# Patient Record
Sex: Female | Born: 1940 | Race: White | Hispanic: No | Smoking: Former smoker
Health system: Southern US, Community
[De-identification: ages and names within clinical notes are randomized; demographics above are authoritative.]

---

## 2003-12-10 ENCOUNTER — Inpatient Hospital Stay (HOSPITAL_COMMUNITY): Admission: EM | Admit: 2003-12-10 | Discharge: 2004-01-03 | Payer: Self-pay | Admitting: *Deleted

## 2003-12-12 ENCOUNTER — Encounter (INDEPENDENT_AMBULATORY_CARE_PROVIDER_SITE_OTHER): Payer: Self-pay | Admitting: Cardiology

## 2003-12-13 ENCOUNTER — Encounter (INDEPENDENT_AMBULATORY_CARE_PROVIDER_SITE_OTHER): Payer: Self-pay | Admitting: Specialist

## 2003-12-20 ENCOUNTER — Encounter (INDEPENDENT_AMBULATORY_CARE_PROVIDER_SITE_OTHER): Payer: Self-pay | Admitting: Cardiology

## 2003-12-22 ENCOUNTER — Encounter: Admission: RE | Admit: 2003-12-22 | Discharge: 2003-12-22 | Payer: Self-pay | Admitting: Family Medicine

## 2003-12-29 ENCOUNTER — Encounter (INDEPENDENT_AMBULATORY_CARE_PROVIDER_SITE_OTHER): Payer: Self-pay | Admitting: Specialist

## 2004-02-07 ENCOUNTER — Encounter: Admission: RE | Admit: 2004-02-07 | Discharge: 2004-02-07 | Payer: Self-pay | Admitting: Family Medicine

## 2004-03-06 ENCOUNTER — Ambulatory Visit: Payer: Self-pay | Admitting: Sports Medicine

## 2004-03-25 ENCOUNTER — Ambulatory Visit: Payer: Self-pay | Admitting: Sports Medicine

## 2004-04-04 ENCOUNTER — Ambulatory Visit: Payer: Self-pay | Admitting: Family Medicine

## 2004-05-06 ENCOUNTER — Ambulatory Visit: Payer: Self-pay | Admitting: Family Medicine

## 2004-05-22 ENCOUNTER — Ambulatory Visit: Payer: Self-pay | Admitting: Family Medicine

## 2004-06-12 ENCOUNTER — Ambulatory Visit: Payer: Self-pay | Admitting: Family Medicine

## 2004-06-14 ENCOUNTER — Encounter (INDEPENDENT_AMBULATORY_CARE_PROVIDER_SITE_OTHER): Payer: Self-pay | Admitting: *Deleted

## 2004-06-14 LAB — CONVERTED CEMR LAB

## 2004-07-09 ENCOUNTER — Ambulatory Visit: Payer: Self-pay | Admitting: Family Medicine

## 2004-08-14 ENCOUNTER — Ambulatory Visit: Payer: Self-pay | Admitting: Family Medicine

## 2004-09-11 ENCOUNTER — Ambulatory Visit: Payer: Self-pay | Admitting: Family Medicine

## 2004-10-11 ENCOUNTER — Ambulatory Visit: Payer: Self-pay | Admitting: Family Medicine

## 2004-11-13 ENCOUNTER — Ambulatory Visit: Payer: Self-pay | Admitting: Family Medicine

## 2004-12-24 ENCOUNTER — Ambulatory Visit: Payer: Self-pay | Admitting: Family Medicine

## 2005-02-04 ENCOUNTER — Ambulatory Visit: Payer: Self-pay | Admitting: Family Medicine

## 2005-04-08 ENCOUNTER — Ambulatory Visit: Payer: Self-pay | Admitting: Family Medicine

## 2005-09-08 ENCOUNTER — Ambulatory Visit: Payer: Self-pay | Admitting: Sports Medicine

## 2005-10-21 ENCOUNTER — Ambulatory Visit: Payer: Self-pay | Admitting: Sports Medicine

## 2006-08-07 ENCOUNTER — Encounter (INDEPENDENT_AMBULATORY_CARE_PROVIDER_SITE_OTHER): Payer: Self-pay | Admitting: *Deleted

## 2006-08-27 ENCOUNTER — Ambulatory Visit: Payer: Self-pay | Admitting: Family Medicine

## 2006-08-27 DIAGNOSIS — R5381 Other malaise: Secondary | ICD-10-CM

## 2006-08-27 DIAGNOSIS — R5383 Other fatigue: Secondary | ICD-10-CM

## 2006-08-27 DIAGNOSIS — R002 Palpitations: Secondary | ICD-10-CM | POA: Insufficient documentation

## 2006-10-15 ENCOUNTER — Encounter: Payer: Self-pay | Admitting: Family Medicine

## 2006-11-19 ENCOUNTER — Encounter: Payer: Self-pay | Admitting: Family Medicine

## 2006-12-02 ENCOUNTER — Ambulatory Visit: Payer: Self-pay | Admitting: Family Medicine

## 2006-12-02 DIAGNOSIS — M159 Polyosteoarthritis, unspecified: Secondary | ICD-10-CM

## 2007-05-14 ENCOUNTER — Ambulatory Visit: Payer: Self-pay | Admitting: Family Medicine

## 2007-05-14 DIAGNOSIS — J310 Chronic rhinitis: Secondary | ICD-10-CM | POA: Insufficient documentation

## 2007-05-14 DIAGNOSIS — M25519 Pain in unspecified shoulder: Secondary | ICD-10-CM

## 2007-05-14 DIAGNOSIS — J449 Chronic obstructive pulmonary disease, unspecified: Secondary | ICD-10-CM

## 2007-05-24 ENCOUNTER — Encounter: Admission: RE | Admit: 2007-05-24 | Discharge: 2007-05-24 | Payer: Self-pay | Admitting: Family Medicine

## 2007-06-21 ENCOUNTER — Ambulatory Visit: Payer: Self-pay | Admitting: Family Medicine

## 2007-06-21 DIAGNOSIS — R030 Elevated blood-pressure reading, without diagnosis of hypertension: Secondary | ICD-10-CM | POA: Insufficient documentation

## 2007-08-23 ENCOUNTER — Ambulatory Visit: Payer: Self-pay | Admitting: Family Medicine

## 2007-08-23 ENCOUNTER — Encounter: Payer: Self-pay | Admitting: *Deleted

## 2007-08-23 DIAGNOSIS — Z87440 Personal history of urinary (tract) infections: Secondary | ICD-10-CM | POA: Insufficient documentation

## 2007-08-23 DIAGNOSIS — N10 Acute tubulo-interstitial nephritis: Secondary | ICD-10-CM

## 2007-08-23 LAB — CONVERTED CEMR LAB
Protein, U semiquant: >=300
pH: 7.5

## 2007-08-25 ENCOUNTER — Encounter: Payer: Self-pay | Admitting: Family Medicine

## 2007-08-25 ENCOUNTER — Telehealth (INDEPENDENT_AMBULATORY_CARE_PROVIDER_SITE_OTHER): Payer: Self-pay | Admitting: *Deleted

## 2007-08-26 ENCOUNTER — Encounter: Admission: RE | Admit: 2007-08-26 | Discharge: 2007-08-26 | Payer: Self-pay | Admitting: Family Medicine

## 2007-09-05 ENCOUNTER — Emergency Department (HOSPITAL_COMMUNITY): Admission: EM | Admit: 2007-09-05 | Discharge: 2007-09-05 | Payer: Self-pay | Admitting: Emergency Medicine

## 2008-01-24 ENCOUNTER — Ambulatory Visit: Payer: Self-pay | Admitting: Family Medicine

## 2008-01-24 LAB — CONVERTED CEMR LAB
ALT: 8 units/L (ref 0–35)
AST: 18 units/L (ref 0–37)
Albumin: 4.5 g/dL (ref 3.5–5.2)
Chloride: 102 meq/L (ref 96–112)
Creatinine, Ser: 1.62 mg/dL — ABNORMAL HIGH (ref 0.40–1.20)
HDL: 52 mg/dL (ref >39)
MCV: 92.7 fL (ref 78.0–100.0)
Potassium: 5.1 meq/L (ref 3.5–5.3)
RDW: 13.6 % (ref 11.5–15.5)
TSH: 1.08 microintl units/mL (ref 0.350–4.50)
Total Protein: 8.4 g/dL — ABNORMAL HIGH (ref 6.0–8.3)

## 2008-03-08 ENCOUNTER — Telehealth: Payer: Self-pay | Admitting: *Deleted

## 2009-03-22 ENCOUNTER — Telehealth: Payer: Self-pay | Admitting: *Deleted

## 2009-10-12 ENCOUNTER — Encounter: Payer: Self-pay | Admitting: Family Medicine

## 2010-01-04 IMAGING — CR DG ABDOMEN 1V
1 series · 1 of 1 positions shown · non-contrast
Comparison: [REDACTED] portable abdominal radiograph, 12/26/03 and [REDACTED] renal ultrasound, 12/11/03.

CLINICAL DATA: Proteus mirabilis pyelonephritis ? assessment for possible kidney stones.
 DIAGNOSTIC ABDOMEN - 1 VIEW:

[t abdomen supine]
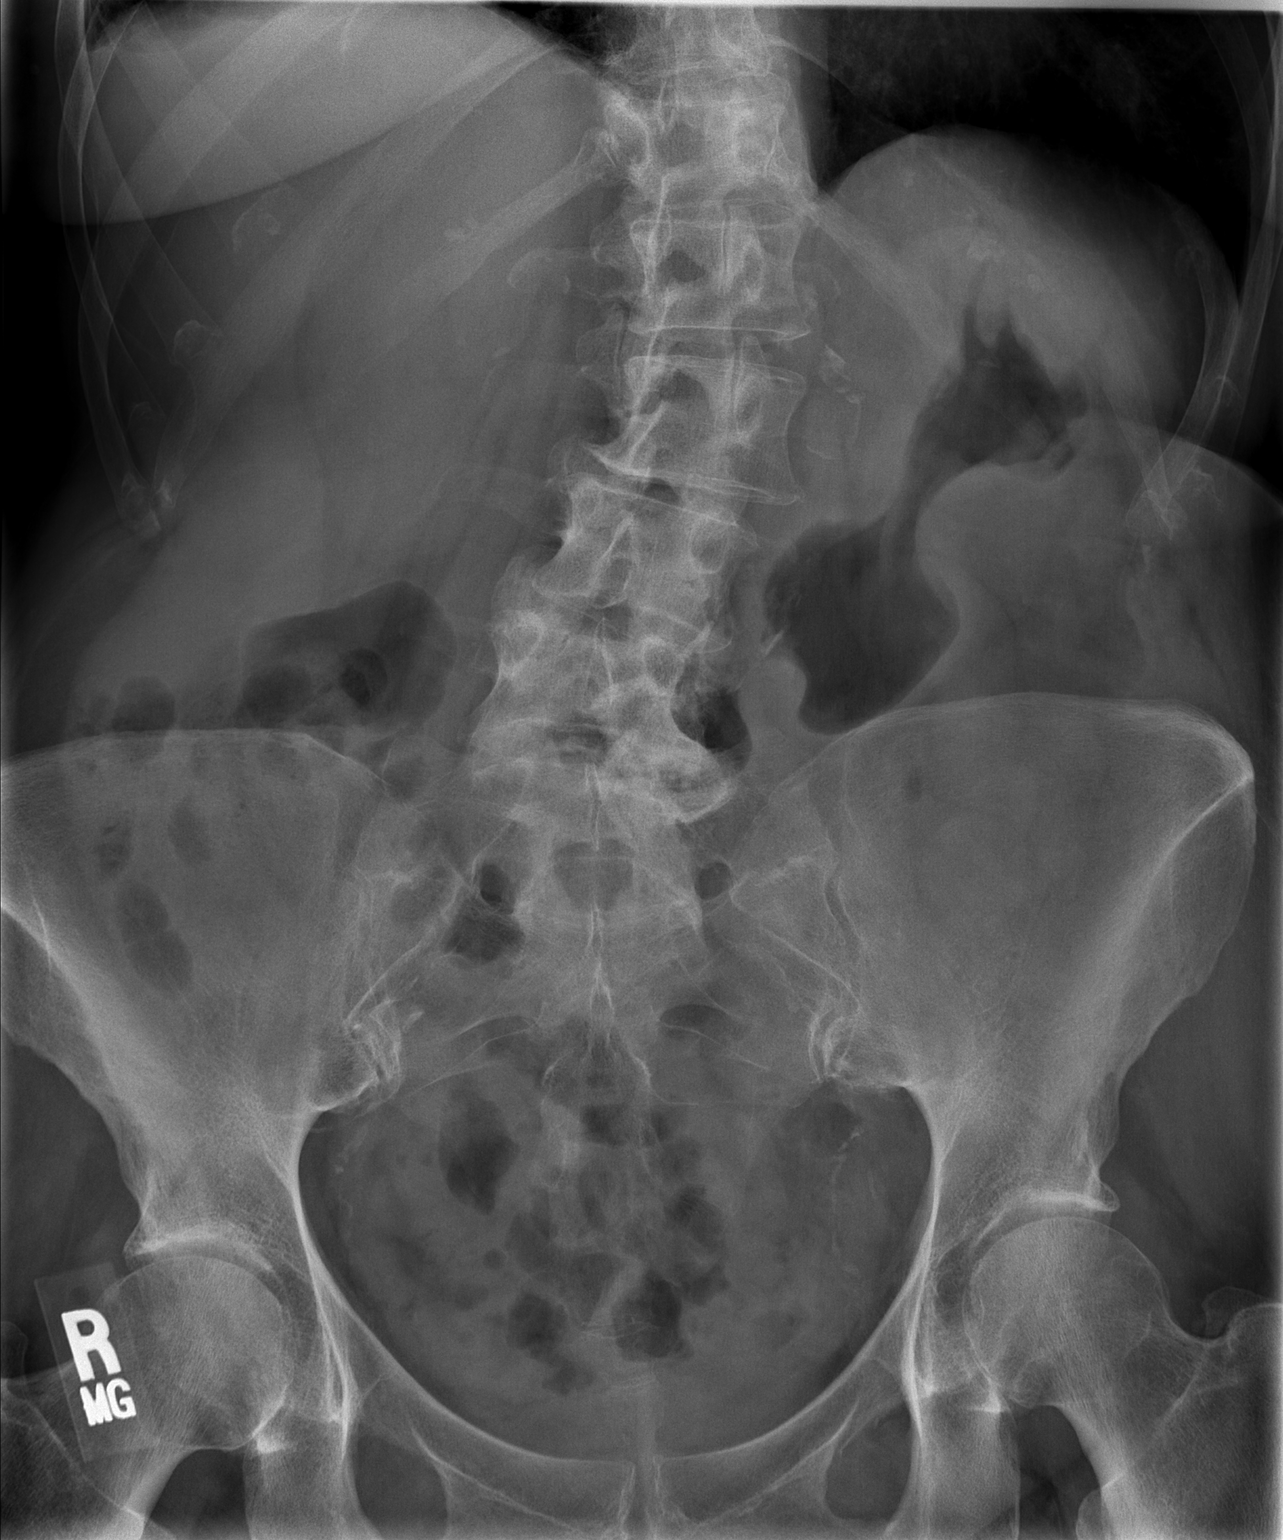

[1 of 1 positions shown; findings below may reference images not displayed]

FINDINGS: No urinary tract calcification seen.  Moderate atheromatous vascular calcification is noted with moderate levoscoliosis thoracolumbar spine junction.  Bowel gas pattern is normal.
IMPRESSION: 1.  No evidence for radiopaque renal calculi.
 2.  Chronic appearing findings as described - no acute abnormality

## 2010-02-28 ENCOUNTER — Encounter: Payer: Self-pay | Admitting: Family Medicine

## 2010-04-15 ENCOUNTER — Telehealth: Payer: Self-pay | Admitting: Family Medicine

## 2010-04-18 ENCOUNTER — Encounter: Payer: Self-pay | Admitting: *Deleted

## 2010-07-10 NOTE — Letter (Signed)
Summary: Wellness Visit Letter  [facility]  [address]   [place], [place] [address]   Phone: [phone]  Fax: [phone]    10/12/2009  [name] [address] [place], [place]  [address]  Dear Ms. Sparger,   We are happy to let you know that since you are covered under Medicare you are able to have a FREE visit at the [facility] to discuss your HEALTH. This is a new benefit for Medicare.  There will be no co-payment.  At this visit you will meet with Luretha Murphy an expert in wellness and the nurse practitioner at our clinic.  At this visit we will discuss ways to keep you healthy and feeling well.  This visit will not replace your regular doctor visit and we cannot refill medications.  We may schedule future blood work, give shots if needed, or schedule tests to look for hidden problems.   You will need to plan to be here at least one hour to talk about your medical history, your current status, review all of your medications, and discuss your future plans for your health.  This information will be entered into your record for your doctor to have and review.  If you are interested in staying healthy, this type of visit can help.  Please call the office at: [phone], to schedule a "Medicare Wellness Visit".  The day of the visit you should bring in all of your medications, including any vitamins, herbs, over the counter products you take.  Make a list of all the other doctors that you see, so we know who they are. If you have any other health documents please bring them.  We look forward to helping you stay healthy.  Sincerely,  Luretha Murphy NP            Appended Document: Wellness Visit Letter mailed.

## 2010-07-10 NOTE — Progress Notes (Signed)
Summary: refill  Phone Note Refill Request Call back at Home Phone [phone] Message from:  Patient  Refills Requested: Medication #1:  METOPROLOL TARTRATE 25 MG TABS take 1/2 tab twice a day  Medication #2:  TRAMADOL HCL 50 MG  TABS one tab four times daily. Initial call taken by: De Nurse,  April 15, 2010 10:28 AM  Follow-up for Phone Call        Done Follow-up by: Doralee Albino MD,  April 15, 2010 4:34 PM    New/Updated Medications: METOPROLOL TARTRATE 25 MG TABS (METOPROLOL TARTRATE) take 1/2 tab twice a day TRAMADOL HCL 50 MG  TABS (TRAMADOL HCL) one tab four times daily Prescriptions: TRAMADOL HCL 50 MG  TABS (TRAMADOL HCL) one tab four times daily  #120 x 5   Entered and Authorized by:   Doralee Albino MD   Signed by:   Doralee Albino MD on 04/15/2010   Method used:   Electronically to        [facility]* (retail)       [address] [place], [place]  [address]       Ph: [phone]       Fax: [phone]   RxID:   [id number] METOPROLOL TARTRATE 25 MG TABS (METOPROLOL TARTRATE) take 1/2 tab twice a day  #90 x 3   Entered and Authorized by:   Doralee Albino MD   Signed by:   Doralee Albino MD on 04/15/2010   Method used:   Electronically to        [facility]* (retail)       [address] [place], [place]  [address]       Ph: [phone]       Fax: [phone]   RxID:   [id number]

## 2010-07-10 NOTE — Miscellaneous (Signed)
Summary: flu vaccine received  Clinical Lists Changes  patient brought by immunization record for Flu vaccine and pneumonia vaccine. received flu vaccine 02/28/2010 at [facility] in [place] on 02/28/2010 and pneumonia and flu  vaccine on 03/01/2009 at [facility] in [place] Observations: Added new observation of FLU VAX: Historical (02/28/2010 10:38) Added new observation of PNEUMOVAX: Historical (03/01/2009 10:38) Added new observation of FLU VAX: Historical (03/01/2009 10:38)      Influenza Immunization History:    Influenza # 1:  Historical (02/28/2010)  Pneumovax Immunization History:    Pneumovax # 1:  Historical (03/01/2009)

## 2010-07-10 NOTE — Miscellaneous (Signed)
Summary: Asthma clarifcation  Clinical Lists Changes  Problems: Changed problem from ASTHMA, UNSPECIFIED, UNSPECIFIED STATUS (ICD-493.90) to ASTHMA, PERSISTENT, MODERATE (ICD-493.90)

## 2010-08-20 ENCOUNTER — Encounter: Payer: Self-pay | Admitting: Home Health Services

## 2010-10-25 NOTE — Consult Note (Signed)
NAME:  Alyssa Carrillo, Alyssa Carrillo                       ACCOUNT NO.:  [id number]   MEDICAL RECORD NO.:  [id number]                   PATIENT TYPE:  INP   LOCATION:  2922                                 FACILITY:  [facility]   PHYSICIAN:  Doylene Canning. Ladona Ridgel, M.D.               DATE OF BIRTH:  [birth year]   DATE OF CONSULTATION:  DATE OF DISCHARGE:                                   CONSULTATION   CONSULTING PHYSICIAN:  Doylene Canning. Ladona Ridgel, M.D.   REQUESTING PHYSICIAN:  Eduardo Osier. Sharyn Lull, M.D.   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   HISTORY OF PRESENT ILLNESS:  The patient is a 70 year old woman without any  significant past medical history who was admitted to the hospital with  malaise, chest pain, shortness of breath of one days' duration.  She was  found to be in atrial fibrillation.  She had chest pain on the right.  Apparently, this was more pleuritic than anything.  She was renal  insufficiency with a creatinine of 2.6 on admission.  Chest x-ray  demonstrated what looked like pneumonia.  The patient unfortunately  developed worsening respiratory function and increasingly rapid atrial  fibrillation with rapid ventricular response and ultimately required  mechanical ventilation.  She has been treated aggressively with IV beta-  blockers, calcium channel-blockers, and most recently amiodarone.  She has  had pauses of over three seconds and underwent permanent temporary pacemaker  insertion all under the direction of Dr. Sharyn Lull.  She is now referred for  additional evaluation.  The patient can not provide any details of her past  medical history but apparently there was a recent outpatient treatment for  pneumonia.   FAMILY HISTORY:  Notable for a deceased mother who had previously had an MI,  and a father who died secondary to pneumonia after abdominal surgery.   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  She  has one child who lives in [place].  She continues to smoke cigarettes.  She has previously denied any alcohol or illicit drug use.   REVIEW OF SYSTEMS:  Obtained by the chart.  She is unable to provide or  answer questions.  Essentially her review of systems was negative, except  for the malaise and myalgias just before admission.   PHYSICAL EXAMINATION:  GENERAL:  Demonstrates an intubated and sedated  middle-aged woman in no distress.  VITAL SIGNS:  The blood pressure was 123/50, the pulse was 95 and regular,  the respirations were 16.  HEENT:  Normocephalic and atraumatic.  The pupils were equal and round.  The  oropharynx was moist.  Sclerae were anicteric.  NECK:  Revealed no jugular venous distention.  Carotids were 2+ and  symmetric.  The trachea was midline.  LUNGS:  Revealed scattered rales and rhonchi throughout.  CARDIOVASCULAR:  Revealed a regular rate and rhythm  with a normal S1 and S2.  There was a soft systolic murmur at the left lower sternal border.  ABDOMEN:  Obese.  It was soft, nontender, nondistended.  There was no  organomegaly.  EXTREMITIES:  Demonstrated no cyanosis, clubbing, or edema.   EKG demonstrated a normal sinus rhythm with normal axis and intervals.  Telemetry strips demonstrate atrial fibrillation throughout ventricular  response as well as bradycardiac with heart rates below 30 beats per minute  and positive over three seconds.   LABS:  Demonstrated a borderline troponin.  The creatinine was back down to  normal at 1.3.   IMPRESSION:  1. Atrial fibrillation with a rapid ventricular response.  2. Sinus pauses of over three seconds on calcium blockers and digoxin.  3. Probable pneumonia and sepsis with subsequent respiratory failure     requiring mechanical ventilation.  4. Status post temporary pacemaker.   DISCUSSION:  We will plan to watch the patient with you.  I would recommend  removing the temporary pacemaker as soon as  possible.  We will plan to  follow the patient with you.  Hopefully, she will not required permanent  pacemaker insertion.  If we can just get her over her underlying illness and  continue amiodarone.                                               Doylene Canning. Ladona Ridgel, M.D.    GWT/MEDQ  D:  12/12/2003  T:  12/12/2003  Job:  [id number]

## 2010-10-25 NOTE — Discharge Summary (Signed)
NAME:  Alyssa Carrillo, Alyssa Carrillo                       ACCOUNT NO.:  [id number]   MEDICAL RECORD NO.:  [id number]                   PATIENT TYPE:  INP   LOCATION:  3315                                 FACILITY:  [facility]   PHYSICIAN:  Dwana Curd. Para March, M.D.              DATE OF BIRTH:  18-May-1941   DATE OF ADMISSION:  12/09/2003  DATE OF DISCHARGE:  01/03/2004                                 DISCHARGE SUMMARY   DISCHARGE DIAGNOSES:  1. Necrotizing pneumonia complicated by septic shock and pneumothorax.  2. Atrial fibrillation with tachycardic-bradycardic syndrome.  3. Increased creatinine, now resolved.  4. Hypokalemia, now resolved.  5. Candidal urinary tract infection.  6. Dermatitis.  7. Diarrhea, now resolved.  8. Anemia, now resolved.  9. Dysphagia secondary to endotracheal intubation and tracheostomy.   DISCHARGE MEDICATIONS:  Please see discharge orders that will accompany the  patient to long term rehab facility.   FOLLOWUP:  The patient needs followup with her primary care physician either  at [facility] or with the physician of her choosing.  This is to be arranged  after the patient is approaching discharge point from the long term rehab  facility.   PROCEDURES/DIAGNOSTIC STUDIES:  1. Renal ultrasound, on December 11, 2003, impression:  Slightly echogenic     kidneys compatible with medical renal disease, normal renal sizes with no     evidence of hydronephrosis, and a small right pleural effusion.  2. Chest CT, on December 14, 2003, showing residual small loculated right     hydropneumothorax despite the presence of chest tube, asymmetric air     space infiltrates most marked in the right middle lobe, left lower lobe     and posterior, and the right lower lobe, cystic changes in the right     middle lobe of uncertain acuity.  No discrete lung abscess is evident.  3. Chest CT, on December 28, 2003, worsening right middle lobe and right lower     lobe air space consolidation, atelectasis with  fluid levels now evident     in the region of the right middle lobe, partial interval improvement in     the left upper and lower lobe air space and alveolar infiltrates, and     loculated right pleural effusion marginally increased in size since     previous exam.  4. Chest tube placement on December 13, 2003.  5. Chest tube removal on December 24, 2003.  6. Chest x-ray, multiple.  7. Thoracentesis on December 29, 2003.  8. PICC line placement on December 21, 2003.  9. EKG, on December 09, 2003, showing sinus tachycardia at 108 beats per minute     with no axis deviation and no acute changes.   CONSULTANTS:  1. Dr. Hermelinda Medicus, ear, nose, and throat.  2. Dr. Jayme Cloud, critical care medicine.  3. Dr. Marlane Mingle, cardiology.  4. Dr. Ladona Ridgel, electrophysiology.   ADMISSION HISTORY AND PHYSICAL:  Please  see dictated H&P.   HOSPITAL COURSE:  1. Necrotizing pneumonia with septic shock complicated by pneumothorax.  The     patient presented, on December 09, 2003, with signs of respiratory distress     and initial chest x-ray showed a right sided polylobar pneumonia.  The     patient was initially placed on azithromycin and Rocephin; however, her     respiratory status continued to decline in the following days.  By December 12, 2003, the patient's respiratory status had deteriorated to the point     where she had to be sedated and placed on a ventilator.  The patient's     course on the vent was complicated by several factors.  The patient was     found on CT to have a necrotizing pneumonia.  The patient was treated     with azithromycin and cefepime.  The patient was hypotensive and required     pressors to maintain adequate blood pressure.  Additionally, the patient     had a right sided pneumothorax requiring chest tube placement on December 13, 2003.  By December 14, 2003, the patient was still in shock requiring the use     of pressors.  Her antibiotic regimen had been changed from azithromycin     and cefepime to  Zosyn with the eventual addition of vancomycin.  The     patient was maintained on Zosyn plus vancomycin for over two weeks.     During this time, the patient had a slow wean from the vent.  Her blood     pressure improved to the point that pressors could be removed.  The     patient was slowly weaned from the vent.  The chest tube was removed on     December 24, 2003.  Due to the protracted nature of the vent wean,     tracheostomy was placed by Dr. Haroldine Laws on December 21, 2003.  At this point,     the patient was placed on trach collar and extubated.  The patient's FIO2     was weaned down over the next days.  By January 02, 2004, the patient's FIO2     requirement was between 35% and 40% on trach collar.  After the patient     was transferred out of the ICU and into the stepdown unit, speech therapy     was consulted and the patient was fitted for Passy-Muir valve.  As of     January 02, 2004, the patient was sating well and able to talk without     respiratory distress on trach collar with her Passy-Muir valve, and at     this point all signs and symptoms of shock were resolved.  The patient     was cultured on multiple times, specifically for pleural fluid with the     patient's pneumothorax and chest tube placement and again for pleural     fluid on aspiration under ultrasound guidance.  At no point did the     cultures grow out a definitive organism.  2. Atrial fibrillation with tachy-brady.  While the patient was critically     ill, the patient began having runs of Afib with long pauses and tachy-     brady syndrome.  At this point, cardiology was consulted who began a     treatment regimen with a beta-blocker and amiodarone.  Electrophysiology     was  consulted for placement of a temporary venous pacemaker.  As the     patient's critical period of illness resolved, the patient experienced     fewer and fewer runs of Afib and resolved her tachy-brady syndrome.  The    pacer was removed without  ill effect.  3. Increased creatinine, now resolved.  The patient presented with initially     increased creatinine that precluded early CT scan with contrast.  The     patient had a renal ultrasound, on December 11, 2003, with the results stated     above.  With hydration, the patient's creatinine trended down and was     stabilized.  By January 02, 2004, the patient's creatinine was within normal     limits.  4. Hypokalemia, now resolved.  The patient experienced multiple episodes of     hypokalemia in the ICU and the stepdown unit.  This was not unexpected     given the patient's need for Lasix.  The patient's potassium was     replenished as needed and at the time of discharge was within normal     limits and stable.  5. Candidal UTI.  The patient had two urine cultures on December 29, 2003.  They     both showed greater than 100,000 colonies of yeast.  These were final.     The patient was treated with fluconazole.  At the time of discharge, the     patient was still undergoing treatment with the fluconazole.  6. Dermatitis.  The patient had a dermatitis during this hospitalization     that did respond to topical nystatin treatment.  At the time of discharge     this dermatitis was resolving.  7. Diarrhea.  The patient had episodic bouts of diarrhea during this     hospitalization.  The diarrhea was attributed to the patient's tube feeds     and to her extensive need for antibiotics.  The patient was notably C.     diff negative x 3.  8. Anemia.  The patient developed anemia during the ICU with heme positive     stools.  The patient was transfused two units of packed red blood cells,     on December 21, 2003, and at this point the anemia was resolved.  At time of     discharge, the patient's hemoglobin/hematocrit had been stable for an     extended period of time.  9. Dysphagia.  The patient was evaluated by speech therapy as the     endotracheal tube was removed and the tracheostomy was placed.   The     patient was evaluated for ability to swallow without aspiration.  The     patient initially failed a swallow study with an FEES study.  However, on     re-evaluation the patient passed her swallow study with the exception of     thin liquids and as of January 02, 2004, her diet had been advanced to     nectar-thick liquids with solids p.o.  This replaced the patient's tube     feeds that she had been receiving for an extended period during this     hospitalization.  The patient was tolerating p.o. well.  It is expected     that after the patient is discharged to a long term rehab facility and     the patient becomes more accustomed to the trach and/or the tracheostomy     is  removed, the patient's aspiration risk to thin liquids will be greatly     diminished.   DISCHARGE LABS:  As follows:  January 02, 2004:  PTT 32, PT 13, INR 1.0.  Reticulocyte count 80.3.  CBC notable for white blood cells of 8.9, hemoglobin 11.5, hematocrit 34.1, platelet count 439.  BMP within normal  limits with the exception of glucose at 125.                                                Dwana Curd Para March, M.D.    GSD/MEDQ  D:  01/02/2004  T:  01/02/2004  Job:  [id number]   cc:   Primary care physician  [facility]   Hermelinda Medicus, M.D.  [address]  [place] [address]  Fax: [phone]   Jayme Cloud, Dr.  Critical Care Medicine   Eduardo Osier. Sharyn Lull, M.D.  [address]  [place]  [place] [address]  Fax: [phone]   Doylene Canning. Ladona Ridgel, M.D.

## 2010-10-25 NOTE — Op Note (Signed)
NAME:  Alyssa Carrillo, Alyssa Carrillo                       ACCOUNT NO.:  [id number]   MEDICAL RECORD NO.:  [id number]                   PATIENT TYPE:  INP   LOCATION:  2922                                 FACILITY:  [facility]   PHYSICIAN:  Hermelinda Medicus, M.D.                DATE OF BIRTH:  September 04, 1940   DATE OF PROCEDURE:  12/21/2003  DATE OF DISCHARGE:                                 OPERATIVE REPORT   INDICATIONS FOR PROCEDURE:  This patient is a 70 year old female who has had  persistent pneumonia.  She also has had a pneumothorax.  She has had a  situation where she has been intubated for approximately nine days and has  not responded as quickly as hoped and she now enters for a tracheostomy for  further respiratory care and respiratory ventilation with less extensive  space between her lungs and the ventilator.   PREOPERATIVE DIAGNOSIS:  Respiratory failure.   POSTOPERATIVE DIAGNOSIS:  Respiratory failure.   PROCEDURE:  Tracheostomy.   SURGEON:  Hermelinda Medicus, M.D.   ANESTHESIA:  Local 1% Xylocaine with epinephrine with MAC standby with Sheldon Silvan, M.D.   DESCRIPTION OF PROCEDURE:  The patient was placed in the supine position and  after the shoulder roll was placed, the patient was prepped with Betadine  and with head straight with anesthesia present in observation.  Sedation was  given, 1% Xylocaine with epinephrine was injected after the patient was  appropriately draped, and then a horizontal incision was made at  approximately the level of the second tracheal ring.  This was carried down  through the skin, platysma, and subcutaneous fat, down to the strap muscles  and the cricoid cartilage was easily palpable.  In the second tracheal ring  we were able to expose without difficulty and without disturbing the isthmus  of the thyroid.  We took a small anterior section, approximately 1 cm in  length of that second tracheal ring.  After all hemostasis was established  in the soft  tissues.  We were able to do this without penetrating so deeply,  we penetrated the cuff of the intubation tube and then we were able to trim  this appropriately so that we had an opening that would be stable.  With  this, all hemostasis was established and then the intubation tube was slowly  retracted and the trachea was suctioned of a considerable amount of mucoid  debris and a #6 single cuff Shiley tracheostomy tube was placed without  difficulty.  Once this was placed, the ventilation was transferred over to  the tracheostomy tube. The cuff was inflated gently and the respiratory  support was continued.  The intubation tube was then removed.  The  tracheostomy tube was then sutured in place using 0 silk and then a  tracheostomy collar was placed to hold the  tracheostomy tube in secure position.  Tracheostomy drape was then placed.  All hemostasis was  completely established and the patient tolerated the  procedure very well.  Preoperative orders will be resumed as before.  She  will be returned to the intensive care unit.                                               Hermelinda Medicus, M.D.    JC/MEDQ  D:  12/21/2003  T:  12/21/2003  Job:  [id number]   cc:   William A. Leveda Anna, M.D.  Fax: [phone]

## 2010-10-25 NOTE — H&P (Signed)
NAME:  Alyssa Carrillo, Alyssa Carrillo                       ACCOUNT NO.:  [id number]   MEDICAL RECORD NO.:  [id number]                   PATIENT TYPE:  INP   LOCATION:  1824                                 FACILITY:  [facility]   PHYSICIAN:  Santiago Bumpers. Hensel, M.D.             DATE OF BIRTH:  1940/12/18   DATE OF ADMISSION:  12/09/2003  DATE OF DISCHARGE:                                HISTORY & PHYSICAL   CHIEF COMPLAINT:  Chest pain and shortness of breath.   HISTORY OF PRESENT ILLNESS:  The patient is a 70 year old white female who  began having muscle and joint pain three days ago with a previous history  of similar symptoms.  These arthralgias and myalgias resolved two days ago  after taking Tylenol.  She had new onset of chest pain with shortness of  breath that began yesterday.  The chest pain was right-sided and the pain  increased with movement and chest wall palpation.  The pain increased  throughout yesterday and today.  She had a concurrent onset of shortness of  breath that has increased in severity yesterday and today.  The patient  reports the use of albuterol inhaler with some temporary relief in the  shortness of breath.  At the presentation the patient reports an 8/10 chest  pain and persistent shortness of breath in spite of morphine 4 mg and 2 L of  O2 via nasal cannula in the emergency room.  The patient also reports that  her shortness of breath was increasing when she was supine and that she had  an increase in her chest pain when she was supine or in the decubitus  position.  The patient has no history of blood clots or no history of a  clotting disorder.  She also reports a positive sensation of warmth for the  past two days without sweats.   PAST MEDICAL HISTORY:  The patient had a recent outpatient treatment for  pneumonia.  This is in November and December 2004.  This was outpatient  treatment.  The patient reports that she has no other health problems or  admissions to  the hospital.  The patient reports that she does not regularly  see an M.D.   FAMILY HISTORY:  The mother is deceased.  She has a positive history of MI  at an unknown age.  The father is deceased secondary to a pneumonia after an  abdominal surgery.  This surgery occurred when her father was in his 11s.  He did have a positive history of TB.   SOCIAL HISTORY:  The patient was an Film/video editor, and she is divorced.  She  has one child who lives in [place].  She presently smokes one-half pack a  day and she reports that she had heavier tobacco use in previous years.  The  patient reports no alcohol nor illicit drug use.   REVIEW OF SYSTEMS:  GENERAL:  The patient  reports a positive feeling of  warmth but without sweats during the past two days.  She has no fevers, no  chills.  HEENT:  She has no vision changes.  GASTROINTESTINAL:  She has no  changes in stooling.  GENITOURINARY:  She has no changes in urination.  CARDIOVASCULAR:  As above per HPI.  RESPIRATORY:  As above per HPI.  SKIN:  No rash.  MUSCULOSKELETAL:  As above per HPI.   MEDICATIONS:  Other than the albuterol that she was using the past few days,  none.   ALLERGIES:  PENICILLIN, anaphylaxis.  IBUPROFEN, angioedema.   PHYSICAL EXAMINATION:  VITAL SIGNS:  Temperature 97.4, pulse 100.7, blood  pressure 85/52, respiratory rate 26, O2 saturation 95% on 4 L of nasal  cannula.  GENERAL:  The patient was sitting in bed in obvious pain with rapid  breathing.  HEENT:  Normocephalic, atraumatic.  Pupils equally round, and reactive to  light and accommodation.  Her tympanic membranes are within normal limits  bilaterally.  She had no oral erythema.  She did have dentures.  NECK:  She had no lymphadenopathy in the cervical or supraclavicular areas.  CARDIOVASCULAR:  She had normal S1 and S2.  She had a regular rate and  rhythm with no murmurs, rubs, or gallops.  She had 2+ pulses in the  extremities that were symmetric.  She had  rapid capillary refill.  CARDIAC:  Respiratory rate:  She had crackles with slight wheezes in the  right lung base, otherwise clear to auscultation with rapid and shallow  inspirations.  ABDOMEN:  Her belly was soft.  She had slight tenderness to very deep  palpation in the right lower quadrant.  She had positive bowel sounds.  She  had positive tympany over the epigastric area.  RECTAL:  Deferred.  EXTREMITIES:  She had no pain on palpation of her lower legs bilaterally.  She had no edema.   LABORATORY EXAMINATIONS AND CHEST X-RAY:  Chest x-ray showed a right-sided  polylobar pneumonia.  Her EKG showed sinus tachycardia at 108 beats per  minute with no axis deviation and no acute changes.  Labs drawn in the  emergency room:  The patient had three sets of CK-MBs, myoglobins, and  Troponin I's.  At 2000 hours the patient had a CK-MB of 10.4, myoglobin of  greater than 500, and troponin I of less than 0.05.  At 2100 hours she had a  CK-MB of 7.8, myoglobin greater than 500, troponin I of less than 0.05.  At  2200 hours CK-MB of 8.5, myoglobin of greater than 500, troponin I of less  than 0.05.  The patient's CMET was as follows:  Sodium 130, potassium 4.5,  chloride 97, bicarb 19, BUN 72, creatinine 2.6, glucose 92, calcium 8.9,  total protein 6.4, albumin 2.6, AST 37, ALT 25, total bilirubin 0.5,  alkaline phosphatase 97.  The patient had a CBC  pending, an ABG pending,  and cardiac enzymes x2 pending.   ASSESSMENT AND PLAN:  This is a 70 year old white female with:   1. Chest pain.  Differential diagnosis includes pulmonary embolus,     myocardial infarction, pneumonia, and lung cancer.  The patient will be     evaluated for pulmonary embolus.  The patient is also being admitted for     rule out myocardial infarction with a.m. electrocardiogram and completion    of a cardiac enzyme cycle.  Pneumonia and lung cancer are less likely     secondary  to fast onset of symptoms and especially  for pneumonia with the     lack of a fever.  Due to the father's history of tuberculosis, we will     place a PPD.  The patient is being given morphine for pain with     supplemental O2.  I will address the need for possible anticoagulation.     Importantly, the patient was initially scheduled for pulmonary embolus     protocol CT and the step of obtaining a D-dimer value was skipped.  This     was because of an initial high index of suspicion for pulmonary embolus;     however, after getting the patient's CMET completed and having a BUN of     72 and a creatinine of 2.6, the patient was deemed not a candidate for     pulmonary embolus protocol CT.  Given this patient's serum creatinine, a     D-dimer has now been ordered and possible need for anticoagulation will     be determined based on the D-dimer value.  In terms of the patient's     creatinine of 2.6, it is unknown if this is the patient's baseline.  This     may be, in fact, due to a prerenal syndrome with dehydration.  Given     that, the patient will be rehydrated throughout the night, the creatinine     will be reassessed.  If possible, the patient may still go for pulmonary     embolus protocol CT pending further lab values.  In the meantime, a     urinalysis and FENA will be obtained.  Also, the patient is being     presumably treated for a pneumonia.  Blood culture are being obtained.     The patient was prescribed azithromycin and Rocephin in the emergency     room.  This will be continued.  Of note, with the patient's cardiac labs,     the troponin was negative in the emergency room and the increase in     myoglobin and CK-MB may be possibly related to the symptoms of myalgia     and arthralgia earlier in the week and/or the patient's increased work of     breathing.  2. Shortness of breath.  The patient will be evaluated as above.     Supplemental O2 will be given as needed.  There is an ABG pending.  Will     follow and  assess need for increased intervention.  3. Fluids, electrolytes, and nutrition.  The patient is tolerating p.o.     Will follow electrolytes and replete as needed.     She is also, as above, being rehydrated, and we will re-evaluate her     serum creatinine and for smoking cessation.  Will discuss the patient for     discharge.   DISPOSITION:  The patient is being admitted to a telemetry bed for further  evaluation and monitoring.      Dwana Curd Para March, M.D.                    William A. Leveda Anna, M.D.    GSD/MEDQ  D:  12/10/2003  T:  12/11/2003  Job:  [phone]

## 2011-04-03 ENCOUNTER — Telehealth: Payer: Self-pay | Admitting: Family Medicine

## 2011-04-03 MED ORDER — TRAMADOL HCL 50 MG PO TABS: 50.0000 mg | ORAL_TABLET | Freq: Four times a day (QID) | ORAL | Status: DC | PRN

## 2011-04-03 NOTE — Telephone Encounter (Signed)
Done and Rx called in.

## 2011-04-03 NOTE — Telephone Encounter (Signed)
Alyssa Carrillo had to resched her appt due to a family emergency to the 11/14 @3 :15.  Need refill on her tramadol until next appt.  Please fax to [facility] on [address].  Call her if there is any problem with this request.

## 2011-04-04 ENCOUNTER — Ambulatory Visit: Payer: Self-pay | Admitting: Family Medicine

## 2011-04-23 ENCOUNTER — Ambulatory Visit (INDEPENDENT_AMBULATORY_CARE_PROVIDER_SITE_OTHER): Payer: Medicare Other | Admitting: Family Medicine

## 2011-04-23 ENCOUNTER — Encounter: Payer: Self-pay | Admitting: Family Medicine

## 2011-04-23 VITALS — BP 174/79 | HR 83 | Ht 70.0 in | Wt 156.4 lb

## 2011-04-23 DIAGNOSIS — I10 Essential (primary) hypertension: Secondary | ICD-10-CM

## 2011-04-23 DIAGNOSIS — R300 Dysuria: Secondary | ICD-10-CM

## 2011-04-23 DIAGNOSIS — R030 Elevated blood-pressure reading, without diagnosis of hypertension: Secondary | ICD-10-CM

## 2011-04-23 DIAGNOSIS — J45909 Unspecified asthma, uncomplicated: Secondary | ICD-10-CM

## 2011-04-23 DIAGNOSIS — N390 Urinary tract infection, site not specified: Secondary | ICD-10-CM | POA: Insufficient documentation

## 2011-04-23 LAB — POCT URINALYSIS DIPSTICK
Bilirubin, UA: NEGATIVE
Glucose, UA: NEGATIVE
Ketones, UA: NEGATIVE
Spec Grav, UA: 1.015
pH, UA: 6

## 2011-04-23 LAB — COMPLETE METABOLIC PANEL WITH GFR
BUN: 55 mg/dL — ABNORMAL HIGH (ref 6–23)
CO2: 29 mEq/L (ref 19–32)
Creat: 1.58 mg/dL — ABNORMAL HIGH (ref 0.50–1.10)
GFR, Est African American: 38 mL/min/{1.73_m2} — ABNORMAL LOW
GFR, Est Non African American: 33 mL/min/{1.73_m2} — ABNORMAL LOW
Glucose, Bld: 84 mg/dL (ref 70–99)
Sodium: 137 mEq/L (ref 135–145)
Total Bilirubin: 0.3 mg/dL (ref 0.3–1.2)
Total Protein: 8 g/dL (ref 6.0–8.3)

## 2011-04-23 LAB — POCT UA - MICROSCOPIC ONLY

## 2011-04-23 MED ORDER — TRAMADOL HCL 50 MG PO TABS: 50.0000 mg | ORAL_TABLET | Freq: Four times a day (QID) | ORAL | Status: DC

## 2011-04-23 MED ORDER — CIPROFLOXACIN HCL 500 MG PO TABS: 500.0000 mg | ORAL_TABLET | Freq: Two times a day (BID) | ORAL | Status: AC

## 2011-04-23 MED ORDER — METOPROLOL TARTRATE 25 MG PO TABS: ORAL_TABLET | ORAL | Status: DC

## 2011-04-23 MED ORDER — ZOSTER VACCINE LIVE 19400 UNT/0.65ML ~~LOC~~ SOLR: 0.6500 mL | Freq: Once | SUBCUTANEOUS | Status: AC

## 2011-04-23 MED ORDER — ALBUTEROL 90 MCG/ACT IN AERS: 2.0000 | INHALATION_SPRAY | Freq: Four times a day (QID) | RESPIRATORY_TRACT | Status: DC | PRN

## 2011-04-23 NOTE — Patient Instructions (Signed)
I refilled both your tramadol and metoprolol Remember, you can take up to 8 regular tylenol per day with the tramadol I sent an antibiotic for your urinary infection cipro. I sent a prescription for the shingles vaccine. I sent a prescription for an inhaler for you to use only when short of breath. As we discussed it is recommended that you get a mammogram and colonoscopy.  I understand you are not interested.  Let me know if you change you mind. Quit grieving about you lost job and get to work on a healthier life pattern.  That may mean you have to do a few things your rather not do.

## 2011-04-24 ENCOUNTER — Encounter: Payer: Self-pay | Admitting: Family Medicine

## 2011-04-24 LAB — LIPID PANEL
Cholesterol: 205 mg/dL — ABNORMAL HIGH (ref 0–200)
HDL: 47 mg/dL (ref >39)

## 2011-04-25 ENCOUNTER — Encounter: Payer: Self-pay | Admitting: Family Medicine

## 2011-04-25 DIAGNOSIS — I10 Essential (primary) hypertension: Secondary | ICD-10-CM | POA: Insufficient documentation

## 2011-04-25 NOTE — Assessment & Plan Note (Signed)
Seems stable off controler.  I provided her with an albuterol inhaler for relief.

## 2011-04-25 NOTE — Assessment & Plan Note (Signed)
I will say that it is well controled based on her home BP readings.

## 2011-04-25 NOTE — Progress Notes (Signed)
  Subjective:    Patient ID: Alyssa Carrillo, female    DOB: [birth year], 70 y.o.   MRN: [id number]  HPI First visit in quite some time for this healthy 70 yo female.  She has a fear of physicians, blood work and hospitals from a near fatal necrotizing pneumonia several years ago.  She also has some strong beliefs.  For example: she refuses both mammography and colonoscopy despite my strong recommendations.  I was able to talk her into some blood work.  Complains of recent dysuria.  Emotionally fine.  She recognizes that she has not been very active since her sudden retirement. (Company moved out of state and she was given the choice of relocating or retiring.)  Financially OK. Eats healthy.    Health maintenance is OK except for mammogram and colonoscopy as above.  She is willing to have the zostavax and I will send a prescription.  Blood pressure high today, which is a surprise to her.  She brings in her home blood pressure readings and they are all good.  Has previously documented white coat hypertension.   Not using any controlers for her asthma/COPD.  She occaisionally gets SOB on exertion.    Review of Systems     Objective:   Physical Exam BP noted Neck supple without adenopathy Lungs, clear Cardiac RRR without m Abd benign. Ext no edema or cyanosis       Assessment & Plan:

## 2011-04-29 ENCOUNTER — Other Ambulatory Visit: Payer: Self-pay | Admitting: Family Medicine

## 2011-12-09 ENCOUNTER — Other Ambulatory Visit: Payer: Self-pay | Admitting: Family Medicine

## 2011-12-09 NOTE — Telephone Encounter (Signed)
Spoke with patient and informed her that she would need to come in for an office visit before this can be filled. Patient was very upset because she was told only to come in 1 time a year for her annual and she can get this medication refilled for a year if she wanted to. I offered her an appointment with a doctor to get this refilled but she stated "I only see Dr. Leveda Anna no one else." I have set her up an appointment 7/10 @ 3pm with Dr. Leveda Anna and she was informed that this is concerned a work in and she may have to wait a little wait to be seen.

## 2011-12-09 NOTE — Telephone Encounter (Signed)
Patient is calling because she needs a refill on her Tramadol.  She is getting ready to leave on a 6 week tour of [place] and needs this before then.  She uses [facility] on [address].  She said that she would appreciate a call once this is done and it is ok to leave a message.

## 2011-12-17 ENCOUNTER — Ambulatory Visit (INDEPENDENT_AMBULATORY_CARE_PROVIDER_SITE_OTHER): Payer: Medicare Other | Admitting: Family Medicine

## 2011-12-17 ENCOUNTER — Encounter: Payer: Self-pay | Admitting: Family Medicine

## 2011-12-17 DIAGNOSIS — N182 Chronic kidney disease, stage 2 (mild): Secondary | ICD-10-CM

## 2011-12-17 DIAGNOSIS — M159 Polyosteoarthritis, unspecified: Secondary | ICD-10-CM

## 2011-12-17 DIAGNOSIS — I10 Essential (primary) hypertension: Secondary | ICD-10-CM

## 2011-12-17 LAB — CBC
Hemoglobin: 14.3 g/dL (ref 12.0–15.0)
MCH: 31.9 pg (ref 26.0–34.0)
MCHC: 33.9 g/dL (ref 30.0–36.0)

## 2011-12-17 MED ORDER — TRAMADOL HCL 50 MG PO TABS: 50.0000 mg | ORAL_TABLET | Freq: Four times a day (QID) | ORAL | Status: DC

## 2011-12-18 ENCOUNTER — Encounter: Payer: Self-pay | Admitting: Family Medicine

## 2011-12-18 DIAGNOSIS — N182 Chronic kidney disease, stage 2 (mild): Secondary | ICD-10-CM | POA: Insufficient documentation

## 2011-12-18 LAB — COMPLETE METABOLIC PANEL WITH GFR
Albumin: 4.7 g/dL (ref 3.5–5.2)
Alkaline Phosphatase: 67 U/L (ref 39–117)
CO2: 24 mEq/L (ref 19–32)
Calcium: 10.2 mg/dL (ref 8.4–10.5)
Chloride: 100 mEq/L (ref 96–112)
GFR, Est African American: 40 mL/min — ABNORMAL LOW
GFR, Est Non African American: 34 mL/min — ABNORMAL LOW
Glucose, Bld: 92 mg/dL (ref 70–99)
Potassium: 4.7 mEq/L (ref 3.5–5.3)
Sodium: 136 mEq/L (ref 135–145)
Total Protein: 8.4 g/dL — ABNORMAL HIGH (ref 6.0–8.3)

## 2011-12-18 NOTE — Progress Notes (Signed)
  Subjective:    Patient ID: Alyssa Carrillo, female    DOB: 07/17/1940, 71 y.o.   MRN: [id number]  HPI  Doing quite well with no complaints.  Leaves for 6 weeks in [place] this weekend. Needs recheck of hypercalcemia. Did not get zostavax due to cost.  Still planning to get  BP up by our read but has known white coat hypertension in addition to regular hypertension.  Home BP readings have been great. Needs refill on tramadol     Review of Systems     Objective:   Physical Exam Lungs clear Cardiac RRR without m or g abd benign        Assessment & Plan:

## 2011-12-18 NOTE — Assessment & Plan Note (Signed)
Home BP readings great so no adjustment in meds.

## 2011-12-18 NOTE — Assessment & Plan Note (Signed)
Stable creat 

## 2011-12-18 NOTE — Assessment & Plan Note (Signed)
Refill tramadol

## 2011-12-18 NOTE — Patient Instructions (Signed)
I will call with labs. Have fun in [place]. Let me know how to help you get your zostavax.

## 2011-12-18 NOTE — Assessment & Plan Note (Signed)
Recheck.  Normal now so will resolve this problem

## 2012-04-15 ENCOUNTER — Other Ambulatory Visit: Payer: Self-pay | Admitting: Family Medicine

## 2012-04-15 MED ORDER — METOPROLOL TARTRATE 25 MG PO TABS: ORAL_TABLET | ORAL | Status: DC

## 2012-04-15 NOTE — Telephone Encounter (Signed)
This patient was last in here in 12/2011, but it looks like this was filled a year ago. Would you like for this patient to come in before refill?

## 2012-04-15 NOTE — Telephone Encounter (Signed)
Pt states that [facility] pharmacy told her that she would need to call us to get refill on her Metoprolol - she will need it by next week.

## 2012-04-15 NOTE — Telephone Encounter (Signed)
Meds refilled.

## 2012-07-29 ENCOUNTER — Other Ambulatory Visit: Payer: Self-pay | Admitting: Family Medicine

## 2012-07-29 MED ORDER — TRAMADOL HCL 50 MG PO TABS: 50.0000 mg | ORAL_TABLET | Freq: Four times a day (QID) | ORAL | Status: DC

## 2012-07-29 NOTE — Telephone Encounter (Signed)
Patient sent me letter asking for refill.  Called to verify pharmacy.

## 2012-09-14 ENCOUNTER — Other Ambulatory Visit: Payer: Self-pay | Admitting: Family Medicine

## 2012-09-14 MED ORDER — TRAMADOL HCL 50 MG PO TABS: ORAL_TABLET | ORAL | Status: DC

## 2013-01-19 ENCOUNTER — Encounter: Payer: Self-pay | Admitting: Family Medicine

## 2013-01-19 ENCOUNTER — Ambulatory Visit (INDEPENDENT_AMBULATORY_CARE_PROVIDER_SITE_OTHER): Payer: Medicare Other | Admitting: Family Medicine

## 2013-01-19 VITALS — BP 130/69 | HR 72 | Temp 97.9°F | Ht 70.5 in | Wt 151.0 lb

## 2013-01-19 DIAGNOSIS — N182 Chronic kidney disease, stage 2 (mild): Secondary | ICD-10-CM

## 2013-01-19 DIAGNOSIS — I10 Essential (primary) hypertension: Secondary | ICD-10-CM

## 2013-01-19 DIAGNOSIS — E7800 Pure hypercholesterolemia, unspecified: Secondary | ICD-10-CM

## 2013-01-19 DIAGNOSIS — M159 Polyosteoarthritis, unspecified: Secondary | ICD-10-CM

## 2013-01-19 MED ORDER — TRAMADOL HCL 50 MG PO TABS: ORAL_TABLET | ORAL | Status: DC

## 2013-01-19 MED ORDER — ZOSTER VACCINE LIVE 19400 UNT/0.65ML ~~LOC~~ SOLR: 0.6500 mL | Freq: Once | SUBCUTANEOUS | Status: DC

## 2013-01-19 MED ORDER — METOPROLOL TARTRATE 25 MG PO TABS: ORAL_TABLET | ORAL | Status: DC

## 2013-01-19 MED ORDER — PRAVASTATIN SODIUM 40 MG PO TABS: 40.0000 mg | ORAL_TABLET | Freq: Every day | ORAL | Status: DC

## 2013-01-19 NOTE — Patient Instructions (Addendum)
I would recommend that you get a bone density to check for osteoporosis.   I sent another prescription for the Shingles vaccine to [facility]..  If you get the vaccine call and let me know so that I can update my records. You can stop your fish oil and start the pravastatin to lower cholesterol.  Statins decrease your risk of heart attack and stroke.   Get your blood work done in 2-3 months.  Remember to call the day before and not eat that morning. You can get your flu shot around the same time.  Let me know as you have in the past when you get your flu shot.

## 2013-01-20 ENCOUNTER — Encounter: Payer: Self-pay | Admitting: Family Medicine

## 2013-01-20 DIAGNOSIS — E7800 Pure hypercholesterolemia, unspecified: Secondary | ICD-10-CM | POA: Insufficient documentation

## 2013-01-20 NOTE — Progress Notes (Signed)
  Subjective:    Patient ID: Alyssa Carrillo, female    DOB: [birth year], 72 y.o.   MRN: [id number]  HPI [name] comes in for the first time in over one year.  She has strong opinions about what she will and will not do.   HBP doing well on meds.  No side effects. COPD.  Quit smoking for several years.  S/P severe pneumonia.  She is not on any meds per her choice.  Definitely gets palpitations and tachycardia with albuterol.  Feels she likely gets with flovent.  She is stable and is not interested in retrying flovent or spiriva at this point. HPDP Refuses mammogram.  Is now willing to get shingles vaccine.  Very good about getting annual flu shot.   Cholesterol  Has been taking Omega 3.  Did 10 year CAD risk which was well above 7.5%  Discussed switch to statin.    Review of Systems Denies chest pain, bleeding, wt change, appetite change, stool change.  Does have DOE but mild and does not want medications.     Objective:   Physical Exam Lungs clear, no wheeze.  Mild decrease vital capacity.  Mild increase AP diameter. Cardiac RRR without m or g Abd benign.       Assessment & Plan:

## 2013-01-20 NOTE — Assessment & Plan Note (Signed)
Well controled on current meds 

## 2013-01-20 NOTE — Assessment & Plan Note (Signed)
Deserves statin based on 10 year risk.  Will start and DC fish oil.  Will check labs when on statin for 2-3 months.  Future orders entered.

## 2013-01-20 NOTE — Assessment & Plan Note (Signed)
Needs recheck lab.  See hypercholesterol

## 2013-01-24 ENCOUNTER — Other Ambulatory Visit: Payer: Self-pay | Admitting: Family Medicine

## 2013-01-24 DIAGNOSIS — I10 Essential (primary) hypertension: Secondary | ICD-10-CM

## 2013-01-24 MED ORDER — METOPROLOL TARTRATE 25 MG PO TABS: ORAL_TABLET | ORAL | Status: DC

## 2013-09-19 ENCOUNTER — Telehealth: Payer: Self-pay | Admitting: Family Medicine

## 2013-09-19 NOTE — Telephone Encounter (Signed)
Spoke with patient and she is fine with that and very appreciative.

## 2013-09-19 NOTE — Telephone Encounter (Signed)
Dear Cl[place] AstersWhite Team i can write it tomorrow as I plan onbeing there---or you can get a preceptor to write it Sorry for inconvenience I am out w a family illness THANKS! Nestor RampSara L Deetta Siegmann

## 2013-09-19 NOTE — Telephone Encounter (Signed)
Refill request for Tramadol 

## 2013-09-20 ENCOUNTER — Other Ambulatory Visit: Payer: Self-pay | Admitting: Family Medicine

## 2013-09-20 DIAGNOSIS — M159 Polyosteoarthritis, unspecified: Secondary | ICD-10-CM

## 2013-09-20 MED ORDER — TRAMADOL HCL 50 MG PO TABS: ORAL_TABLET | ORAL | Status: DC

## 2013-09-20 NOTE — Progress Notes (Signed)
Dear Cliffton AstersWhite Team I am giving her one month supply--to get refills etc she will have [facility] Nestor RampSara L Jaquay Morneault

## 2013-09-22 ENCOUNTER — Other Ambulatory Visit: Payer: Self-pay | Admitting: Family Medicine

## 2013-09-22 DIAGNOSIS — M159 Polyosteoarthritis, unspecified: Secondary | ICD-10-CM

## 2013-09-22 MED ORDER — TRAMADOL HCL 50 MG PO TABS: ORAL_TABLET | ORAL | Status: DC

## 2013-09-22 NOTE — Telephone Encounter (Signed)
Received letter requesting refill.  She does annual exams each August.  Tramadol appropriate and called in.

## 2013-12-28 ENCOUNTER — Encounter: Payer: Self-pay | Admitting: Home Health Services

## 2014-01-26 ENCOUNTER — Encounter: Payer: Self-pay | Admitting: Home Health Services

## 2014-02-08 ENCOUNTER — Ambulatory Visit: Payer: Medicare Other | Admitting: Family Medicine

## 2014-02-15 ENCOUNTER — Ambulatory Visit (INDEPENDENT_AMBULATORY_CARE_PROVIDER_SITE_OTHER): Payer: Medicare Other | Admitting: Family Medicine

## 2014-02-15 ENCOUNTER — Encounter: Payer: Self-pay | Admitting: Family Medicine

## 2014-02-15 VITALS — BP 143/74 | HR 82 | Temp 98.5°F | Ht 70.5 in | Wt 146.5 lb

## 2014-02-15 DIAGNOSIS — N3000 Acute cystitis without hematuria: Secondary | ICD-10-CM

## 2014-02-15 DIAGNOSIS — Z23 Encounter for immunization: Secondary | ICD-10-CM

## 2014-02-15 DIAGNOSIS — E7800 Pure hypercholesterolemia, unspecified: Secondary | ICD-10-CM

## 2014-02-15 DIAGNOSIS — R300 Dysuria: Secondary | ICD-10-CM

## 2014-02-15 DIAGNOSIS — M159 Polyosteoarthritis, unspecified: Secondary | ICD-10-CM

## 2014-02-15 DIAGNOSIS — R030 Elevated blood-pressure reading, without diagnosis of hypertension: Secondary | ICD-10-CM

## 2014-02-15 DIAGNOSIS — N182 Chronic kidney disease, stage 2 (mild): Secondary | ICD-10-CM

## 2014-02-15 DIAGNOSIS — N390 Urinary tract infection, site not specified: Secondary | ICD-10-CM | POA: Insufficient documentation

## 2014-02-15 DIAGNOSIS — I10 Essential (primary) hypertension: Secondary | ICD-10-CM

## 2014-02-15 DIAGNOSIS — M150 Primary generalized (osteo)arthritis: Secondary | ICD-10-CM

## 2014-02-15 LAB — COMPREHENSIVE METABOLIC PANEL
ALK PHOS: 90 U/L (ref 39–117)
ALT: 15 U/L (ref 0–35)
AST: 25 U/L (ref 0–37)
Albumin: 3.7 g/dL (ref 3.5–5.2)
BUN: 28 mg/dL — ABNORMAL HIGH (ref 6–23)
CO2: 29 mEq/L (ref 19–32)
Calcium: 9.6 mg/dL (ref 8.4–10.5)
Chloride: 99 mEq/L (ref 96–112)
Creat: 1.17 mg/dL — ABNORMAL HIGH (ref 0.50–1.10)
Glucose, Bld: 78 mg/dL (ref 70–99)
Potassium: 4.8 mEq/L (ref 3.5–5.3)
SODIUM: 137 meq/L (ref 135–145)
TOTAL PROTEIN: 7.9 g/dL (ref 6.0–8.3)
Total Bilirubin: 0.3 mg/dL (ref 0.2–1.2)

## 2014-02-15 LAB — POCT UA - MICROSCOPIC ONLY

## 2014-02-15 LAB — POCT URINALYSIS DIPSTICK
Bilirubin, UA: NEGATIVE
Glucose, UA: NEGATIVE
KETONES UA: NEGATIVE
Nitrite, UA: NEGATIVE
Spec Grav, UA: 1.015
Urobilinogen, UA: 0.2
pH, UA: 6.5

## 2014-02-15 LAB — CBC
HCT: 34.8 % — ABNORMAL LOW (ref 36.0–46.0)
Hemoglobin: 11.3 g/dL — ABNORMAL LOW (ref 12.0–15.0)
MCH: 29.4 pg (ref 26.0–34.0)
MCHC: 32.5 g/dL (ref 30.0–36.0)
MCV: 90.6 fL (ref 78.0–100.0)
Platelets: 501 10*3/uL — ABNORMAL HIGH (ref 150–400)
RBC: 3.84 MIL/uL — ABNORMAL LOW (ref 3.87–5.11)
RDW: 15.4 % (ref 11.5–15.5)
WBC: 7.9 10*3/uL (ref 4.0–10.5)

## 2014-02-15 LAB — LIPID PANEL
CHOLESTEROL: 205 mg/dL — AB (ref 0–200)
HDL: 38 mg/dL — AB (ref >39)
LDL Cholesterol: 100 mg/dL — ABNORMAL HIGH (ref 0–99)
Total CHOL/HDL Ratio: 5.4 Ratio
Triglycerides: 333 mg/dL — ABNORMAL HIGH (ref <150)
VLDL: 67 mg/dL — AB (ref 0–40)

## 2014-02-15 MED ORDER — TRAMADOL HCL 50 MG PO TABS: ORAL_TABLET | ORAL | Status: DC

## 2014-02-15 MED ORDER — METOPROLOL TARTRATE 25 MG PO TABS: ORAL_TABLET | ORAL | Status: DC

## 2014-02-15 MED ORDER — SULFAMETHOXAZOLE-TMP DS 800-160 MG PO TABS: 1.0000 | ORAL_TABLET | Freq: Two times a day (BID) | ORAL | Status: DC

## 2014-02-15 MED ORDER — ZOSTER VACCINE LIVE 19400 UNT/0.65ML ~~LOC~~ SOLR: 0.6500 mL | Freq: Once | SUBCUTANEOUS | Status: DC

## 2014-02-15 NOTE — Patient Instructions (Signed)
You should get three immunizations this fall 1. Prevnar - the new pneumonia vaccine today. 2. Flu shot - you will get today. 3. I will give you a prescription for the shingles vaccine.  I will call with the blood work results Start taking one calcium pill per day again. Keep an eye on your blood pressure at home.  Let me know if the average is more than 140/90  See me in six months. Start getting more active as the weather cools down.

## 2014-02-16 ENCOUNTER — Encounter: Payer: Self-pay | Admitting: Family Medicine

## 2014-02-16 LAB — TSH: TSH: 1.281 u[IU]/mL (ref 0.350–4.500)

## 2014-02-16 NOTE — Assessment & Plan Note (Signed)
Allergic to cipro Throat swelling with penicillin - so I will avoid cephalosporins. Treat with bactrim.

## 2014-02-16 NOTE — Progress Notes (Signed)
   Subjective:    Patient ID: Alyssa Carrillo, female    DOB: 05-10-1941, 73 y.o.   MRN: [id number]  HPI once a year visit for this patient who has very strict ideas about what she does and does not want from medicine.  She has a PTSD type picture from a severe pneumonia that almost killed her several years ago.  Issues 1. BP up in the office.  She follows at home and BP is fine.  She has both true HBP and white coat hypertension.  I believe her home BP readings. 2. Dysuria for several weeks.  Feels generally bad.  No fever. 3. Likely has an element of COPD.  Not currently exercising.  Hot weather makes it difficult for her to walk outside due to breathing.   4. Way behind on HPDP.  Will take immunizations. To get flu and prevnar today.  States will take the shingles vaccine if I prescribe again.  Not interested in testing.  Specifically refused mammo, colon cancer screen (even hemocults) and bone density.   Does take tramadol and wants refill    Review of Systems Neg ROS except as above.     Objective:   Physical ExamLungs clear Cardiac RRR wihout m or g Abd benign        Assessment & Plan:

## 2014-02-16 NOTE — Assessment & Plan Note (Signed)
Refill tramadol

## 2014-02-16 NOTE — Assessment & Plan Note (Addendum)
Recheck is lower LDL which I attribute to her weight loss.

## 2014-02-16 NOTE — Assessment & Plan Note (Signed)
Nicely improved. 

## 2014-02-16 NOTE — Assessment & Plan Note (Signed)
Likely good control based on home BP

## 2014-02-16 NOTE — Assessment & Plan Note (Signed)
Likely cause of elevated BP in office despite good at home.

## 2014-03-28 ENCOUNTER — Other Ambulatory Visit: Payer: Self-pay | Admitting: Family Medicine

## 2014-03-28 ENCOUNTER — Telehealth: Payer: Self-pay | Admitting: Family Medicine

## 2014-03-28 DIAGNOSIS — R300 Dysuria: Secondary | ICD-10-CM

## 2014-03-28 DIAGNOSIS — N3000 Acute cystitis without hematuria: Secondary | ICD-10-CM

## 2014-03-28 NOTE — Telephone Encounter (Signed)
Pt called and still has a UTI. She said it went away mostly but now seems to have come back. She did a OTC test and she said it was positive. She would like Dr. Leveda AnnaHensel to call in the same medication as before. She would also like this call into [facility] on  [address] so that she can use the drive through. jw

## 2014-03-28 NOTE — Telephone Encounter (Signed)
Spoke with patietn and informed her that I talked to Dr. Leveda AnnaHensel and he suggested she come in first and give a urine sample. Patient was not happy about that but then agreed about coming in for a urine sample tomorrow and orders have been pout in. Once the results are back we will let Dr. Leveda AnnaHensel and then go from there on any antibiotics needed

## 2014-03-29 ENCOUNTER — Other Ambulatory Visit (INDEPENDENT_AMBULATORY_CARE_PROVIDER_SITE_OTHER): Payer: Medicare Other

## 2014-03-29 DIAGNOSIS — R300 Dysuria: Secondary | ICD-10-CM

## 2014-03-29 LAB — POCT URINALYSIS DIPSTICK
Bilirubin, UA: NEGATIVE
GLUCOSE UA: NEGATIVE
Ketones, UA: NEGATIVE
Nitrite, UA: NEGATIVE
Spec Grav, UA: 1.01
UROBILINOGEN UA: 0.2
pH, UA: 6

## 2014-03-29 LAB — POCT UA - MICROSCOPIC ONLY

## 2014-03-29 NOTE — Progress Notes (Signed)
Pt came for routine urine and microscopic, also sent culture on urine

## 2014-03-30 MED ORDER — SULFAMETHOXAZOLE-TMP DS 800-160 MG PO TABS: 1.0000 | ORAL_TABLET | Freq: Two times a day (BID) | ORAL | Status: DC

## 2014-03-30 NOTE — Addendum Note (Signed)
Addended by: Uvaldo RisingFLETKE, KYLE J on: 03/30/2014 08:43 AM   Modules accepted: Orders

## 2014-03-30 NOTE — Telephone Encounter (Signed)
Dr. Leveda AnnaHensel out of office, UA brought to my attention by nursing staff, urine culture pending, will send in Bactrim DS 1 tablet BID for 7 days (unable to attempt trial of Cipro/penicillin due to allergies)

## 2014-04-01 LAB — URINE CULTURE: Colony Count: >=100000

## 2014-09-26 ENCOUNTER — Other Ambulatory Visit: Payer: Self-pay | Admitting: Family Medicine

## 2014-09-26 DIAGNOSIS — M159 Polyosteoarthritis, unspecified: Secondary | ICD-10-CM

## 2014-09-26 DIAGNOSIS — M150 Primary generalized (osteo)arthritis: Principal | ICD-10-CM

## 2014-09-26 NOTE — Telephone Encounter (Signed)
Pt called and would a refill on her Tramadol left up front. Please call when ready and if she doesn't answer please leave a voicemail. jw

## 2014-09-27 MED ORDER — TRAMADOL HCL 50 MG PO TABS: ORAL_TABLET | ORAL | Status: DC

## 2015-04-12 ENCOUNTER — Telehealth: Payer: Self-pay | Admitting: Family Medicine

## 2015-04-12 DIAGNOSIS — M150 Primary generalized (osteo)arthritis: Secondary | ICD-10-CM

## 2015-04-12 DIAGNOSIS — M159 Polyosteoarthritis, unspecified: Secondary | ICD-10-CM

## 2015-04-12 DIAGNOSIS — I10 Essential (primary) hypertension: Secondary | ICD-10-CM

## 2015-04-12 MED ORDER — TRAMADOL HCL 50 MG PO TABS: ORAL_TABLET | ORAL | Status: DC

## 2015-04-12 MED ORDER — METOPROLOL TARTRATE 25 MG PO TABS: ORAL_TABLET | ORAL | Status: DC

## 2015-04-12 NOTE — Telephone Encounter (Signed)
Called and [facility] [address]  It is on [address]

## 2015-04-12 NOTE — Telephone Encounter (Signed)
Pt asking for a refill on her metoprolol and tramadol, says she is unable to make it over here because she is helping take care of someone, [facility]

## 2015-11-07 ENCOUNTER — Telehealth: Payer: Self-pay | Admitting: Family Medicine

## 2015-11-07 DIAGNOSIS — M150 Primary generalized (osteo)arthritis: Principal | ICD-10-CM

## 2015-11-07 DIAGNOSIS — M159 Polyosteoarthritis, unspecified: Secondary | ICD-10-CM

## 2015-11-07 MED ORDER — TRAMADOL HCL 50 MG PO TABS: ORAL_TABLET | ORAL | Status: DC

## 2015-11-07 NOTE — Telephone Encounter (Signed)
Done and patient notified.

## 2015-11-07 NOTE — Telephone Encounter (Signed)
Pt called and needs to have a refill on her Tramadol. Please let patient know if there any problems and also that this is done so that she can pick this up. jw

## 2016-04-09 ENCOUNTER — Other Ambulatory Visit: Payer: Self-pay | Admitting: Family Medicine

## 2016-04-09 DIAGNOSIS — I10 Essential (primary) hypertension: Secondary | ICD-10-CM

## 2016-06-16 ENCOUNTER — Other Ambulatory Visit: Payer: Self-pay | Admitting: *Deleted

## 2016-06-16 DIAGNOSIS — I10 Essential (primary) hypertension: Secondary | ICD-10-CM

## 2016-06-16 DIAGNOSIS — M150 Primary generalized (osteo)arthritis: Secondary | ICD-10-CM

## 2016-06-16 DIAGNOSIS — M159 Polyosteoarthritis, unspecified: Secondary | ICD-10-CM

## 2016-06-18 ENCOUNTER — Other Ambulatory Visit: Payer: Self-pay | Admitting: Family Medicine

## 2016-06-18 DIAGNOSIS — M150 Primary generalized (osteo)arthritis: Secondary | ICD-10-CM

## 2016-06-18 DIAGNOSIS — I10 Essential (primary) hypertension: Secondary | ICD-10-CM

## 2016-06-18 DIAGNOSIS — M159 Polyosteoarthritis, unspecified: Secondary | ICD-10-CM

## 2016-06-20 ENCOUNTER — Other Ambulatory Visit: Payer: Self-pay | Admitting: Family Medicine

## 2016-06-20 DIAGNOSIS — I10 Essential (primary) hypertension: Secondary | ICD-10-CM

## 2016-06-20 DIAGNOSIS — M159 Polyosteoarthritis, unspecified: Secondary | ICD-10-CM

## 2016-06-20 DIAGNOSIS — M150 Primary generalized (osteo)arthritis: Secondary | ICD-10-CM

## 2016-06-20 NOTE — Telephone Encounter (Signed)
Pt scheduled an appointment with Dr. Leveda AnnaHensel for the 24th, but will run out of metoprolol and tramadol before then. Pt would like to know if she could get refills just enough to last until next appointment. ep

## 2016-06-23 MED ORDER — METOPROLOL TARTRATE 25 MG PO TABS: 12.5000 mg | ORAL_TABLET | Freq: Two times a day (BID) | ORAL | 0 refills | Status: DC

## 2016-06-23 MED ORDER — TRAMADOL HCL 50 MG PO TABS: ORAL_TABLET | ORAL | 0 refills | Status: DC

## 2016-07-02 ENCOUNTER — Ambulatory Visit: Payer: Self-pay | Admitting: Family Medicine

## 2016-07-10 ENCOUNTER — Ambulatory Visit (INDEPENDENT_AMBULATORY_CARE_PROVIDER_SITE_OTHER): Payer: Medicare Other | Admitting: Family Medicine

## 2016-07-10 ENCOUNTER — Encounter: Payer: Self-pay | Admitting: Family Medicine

## 2016-07-10 VITALS — BP 138/82 | HR 82 | Temp 98.2°F | Ht 71.0 in | Wt 146.4 lb

## 2016-07-10 DIAGNOSIS — R300 Dysuria: Secondary | ICD-10-CM

## 2016-07-10 DIAGNOSIS — J449 Chronic obstructive pulmonary disease, unspecified: Secondary | ICD-10-CM

## 2016-07-10 DIAGNOSIS — F4310 Post-traumatic stress disorder, unspecified: Secondary | ICD-10-CM

## 2016-07-10 DIAGNOSIS — M159 Polyosteoarthritis, unspecified: Secondary | ICD-10-CM

## 2016-07-10 DIAGNOSIS — I10 Essential (primary) hypertension: Secondary | ICD-10-CM

## 2016-07-10 DIAGNOSIS — M150 Primary generalized (osteo)arthritis: Secondary | ICD-10-CM | POA: Diagnosis not present

## 2016-07-10 DIAGNOSIS — N182 Chronic kidney disease, stage 2 (mild): Secondary | ICD-10-CM

## 2016-07-10 DIAGNOSIS — Z0000 Encounter for general adult medical examination without abnormal findings: Secondary | ICD-10-CM

## 2016-07-10 DIAGNOSIS — E7800 Pure hypercholesterolemia, unspecified: Secondary | ICD-10-CM

## 2016-07-10 DIAGNOSIS — N3000 Acute cystitis without hematuria: Secondary | ICD-10-CM | POA: Diagnosis not present

## 2016-07-10 LAB — POCT URINALYSIS DIPSTICK
BILIRUBIN UA: NEGATIVE
Glucose, UA: NEGATIVE
KETONES UA: NEGATIVE
Nitrite, UA: NEGATIVE
PH UA: 6
Spec Grav, UA: 1.015
Urobilinogen, UA: 0.2

## 2016-07-10 LAB — POCT UA - MICROSCOPIC ONLY

## 2016-07-10 MED ORDER — HYDROCHLOROTHIAZIDE 12.5 MG PO TABS: 12.5000 mg | ORAL_TABLET | Freq: Every day | ORAL | 3 refills | Status: AC

## 2016-07-10 MED ORDER — TRAMADOL HCL 50 MG PO TABS: ORAL_TABLET | ORAL | 0 refills | Status: DC

## 2016-07-10 MED ORDER — METOPROLOL TARTRATE 25 MG PO TABS: 12.5000 mg | ORAL_TABLET | Freq: Two times a day (BID) | ORAL | 3 refills | Status: DC

## 2016-07-10 MED ORDER — CEPHALEXIN 500 MG PO CAPS: 500.0000 mg | ORAL_CAPSULE | Freq: Two times a day (BID) | ORAL | 0 refills | Status: AC

## 2016-07-10 NOTE — Patient Instructions (Addendum)
You seem to be in very good shape. Please find a way to take a couple of 15 minute exercise breaks each day.   I sent in a new prescription for a mild fluid pill HCTZ=hydrochlorothiazide.  Take in the morning See me in a few months if you think you need a medication for the night urinary frequency. I sent in an antibiotic for your UTI I will want to give you the new shingles vaccine when it comes out.

## 2016-07-10 NOTE — Progress Notes (Signed)
poc

## 2016-07-11 DIAGNOSIS — F4310 Post-traumatic stress disorder, unspecified: Secondary | ICD-10-CM | POA: Insufficient documentation

## 2016-07-11 DIAGNOSIS — Z0000 Encounter for general adult medical examination without abnormal findings: Secondary | ICD-10-CM | POA: Insufficient documentation

## 2016-07-11 NOTE — Assessment & Plan Note (Signed)
Rx keflex 

## 2016-07-11 NOTE — Assessment & Plan Note (Signed)
Due for lipid panel

## 2016-07-11 NOTE — Assessment & Plan Note (Signed)
Very stable off all controlers.  Knows to get flu shot and avoid exposure to resp infections.

## 2016-07-11 NOTE — Assessment & Plan Note (Signed)
Add HCTZ based on swelling and elevated home BPs.

## 2016-07-11 NOTE — Progress Notes (Signed)
   Subjective:    Patient ID: Alyssa StaggersSharon S Stiverson, female    DOB: 08/21/1940, 76 y.o.   MRN: [id number]  HPI Ms Edmonia JamesCaviness, as typical, is overdue for her annual assessment.  She really has problems seeking health care after her near fatal pneumonia - a reaction that is severe enough to label as PTSD.  Comes in now because I did not refill meds.  Issues: 1. Chronic shoulder pain.  Likely rotator cuff syndrome.  Plus multiple joint aches which I have not investigated but statiscally are likely OA.  Uses tramadol to stay active. 2. Hypertension.  Has been running a bit high on home BPs.  OK today.  Also noted some mild dependant swelling of feet.  No CP, SOB 3. Known CKD.  Swelling of feet as above.  Otherwise asymptomatic. 4. Frequent UTIs.  Has dysuria again. 5. COPD with asthma.  Not on any controler meds.  No flairs since her terrible pneumonia ~10 years ago. 6. HPDP up to date on flu and pneumonia.  Never had shingles - we willnow wait until new vaccine available.  Needs booster tetanus the next time she has a cut or injury. Does not intend any colon cancer screening.  At age 76, she is aging out of many screening recommendations (which she was poor at following through anyway due to her PTSD.) 7. PTSD when it comes to doctors and health care.  Avoids facilities, sometimes to her detriment. 8. Diet and exercise.  Frequent fast food.  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  Knows that she needs more activity.  ROS see HPI plus Denies change in wt, appetite or BM.  No bleeding or worrisome skin changes.      Review of Systems     Objective:   Physical ExamHEENT normal Neck, supple without mass Lungs clear - a bit barrel chested. Cardiac RRR without m or g Abd benign.  Mild suprapubic tenderness Ankles 1+ edema bilaterally. Neuro Motor and sensory grossly intact.        Assessment & Plan:

## 2016-07-11 NOTE — Assessment & Plan Note (Signed)
Refill tramadol

## 2016-07-11 NOTE — Assessment & Plan Note (Signed)
Other than avoiding doctors, Alyssa Carrillo does a great job of remaining healthy.  Mainly discussed increasing exercise.

## 2016-07-11 NOTE — Assessment & Plan Note (Signed)
Will check creat. 

## 2017-01-06 ENCOUNTER — Other Ambulatory Visit: Payer: Self-pay | Admitting: Family Medicine

## 2017-01-06 DIAGNOSIS — M150 Primary generalized (osteo)arthritis: Principal | ICD-10-CM

## 2017-01-06 DIAGNOSIS — M159 Polyosteoarthritis, unspecified: Secondary | ICD-10-CM

## 2017-01-08 ENCOUNTER — Other Ambulatory Visit: Payer: Self-pay | Admitting: Family Medicine

## 2017-01-08 DIAGNOSIS — M159 Polyosteoarthritis, unspecified: Secondary | ICD-10-CM

## 2017-01-08 DIAGNOSIS — M150 Primary generalized (osteo)arthritis: Principal | ICD-10-CM

## 2017-01-08 NOTE — Telephone Encounter (Signed)
Called in Rx to pharmacy. Zimmerman Rumple, [place] D, CMA  

## 2017-01-08 NOTE — Telephone Encounter (Signed)
Please call in tramadol Rx Thanks  LC

## 2017-02-12 ENCOUNTER — Other Ambulatory Visit: Payer: Self-pay | Admitting: *Deleted

## 2017-02-12 DIAGNOSIS — M159 Polyosteoarthritis, unspecified: Secondary | ICD-10-CM

## 2017-02-12 DIAGNOSIS — M150 Primary generalized (osteo)arthritis: Principal | ICD-10-CM

## 2017-02-12 MED ORDER — TRAMADOL HCL 50 MG PO TABS: 50.0000 mg | ORAL_TABLET | Freq: Four times a day (QID) | ORAL | 3 refills | Status: DC

## 2017-07-12 ENCOUNTER — Other Ambulatory Visit: Payer: Self-pay | Admitting: Family Medicine

## 2017-07-12 DIAGNOSIS — I10 Essential (primary) hypertension: Secondary | ICD-10-CM

## 2017-08-11 ENCOUNTER — Other Ambulatory Visit: Payer: Self-pay | Admitting: Family Medicine

## 2017-08-11 DIAGNOSIS — M150 Primary generalized (osteo)arthritis: Principal | ICD-10-CM

## 2017-08-11 DIAGNOSIS — M159 Polyosteoarthritis, unspecified: Secondary | ICD-10-CM

## 2018-01-12 ENCOUNTER — Other Ambulatory Visit: Payer: Self-pay | Admitting: Family Medicine

## 2018-01-12 DIAGNOSIS — M150 Primary generalized (osteo)arthritis: Principal | ICD-10-CM

## 2018-01-12 DIAGNOSIS — M159 Polyosteoarthritis, unspecified: Secondary | ICD-10-CM

## 2018-06-29 ENCOUNTER — Telehealth: Payer: Self-pay | Admitting: Family Medicine

## 2018-06-29 NOTE — Telephone Encounter (Signed)
Death Certificate was dropped off and has been placed in PCP's box for completion. Please use blue or black ink, no strike throughs or white out.  (Note:  Funeral home was informed that patient date of birth was wrong, were told to redo death certificate.  However funeral home used white out to correct date of birth.  This will be noted on copy that eventually goes to scanning. Please sign and return to [place] once completed.

## 2018-06-29 NOTE — Telephone Encounter (Signed)
I am sad to hear of Alyssa Carrillo death.  She did not see me often.  She had PTSD from a prolonged hospitalization for severe pneumonia and did not like visiting medical providers.  I have reviewed her chart and find no notes re the circumstances of her death.  I tried calling the number listed and there was no answer.    Since my only known contact is with the funeral home, I will ask them to call to better understand the events leading to her death.

## 2018-07-10 ENCOUNTER — Other Ambulatory Visit: Payer: Self-pay | Admitting: Family Medicine

## 2018-07-10 DIAGNOSIS — I10 Essential (primary) hypertension: Secondary | ICD-10-CM

## 2018-07-10 DEATH — deceased
